# Patient Record
Sex: Male | Born: 1945 | Race: White | Hispanic: No | Marital: Married | Smoking: Former smoker
Health system: Southern US, Community
[De-identification: ages and names within clinical notes are randomized; demographics above are authoritative.]

## PROBLEM LIST (undated history)

## (undated) DIAGNOSIS — I2510 Atherosclerotic heart disease of native coronary artery without angina pectoris: Secondary | ICD-10-CM

## (undated) DIAGNOSIS — I10 Essential (primary) hypertension: Secondary | ICD-10-CM

## (undated) DIAGNOSIS — N189 Chronic kidney disease, unspecified: Secondary | ICD-10-CM

## (undated) DIAGNOSIS — I219 Acute myocardial infarction, unspecified: Secondary | ICD-10-CM

## (undated) DIAGNOSIS — E785 Hyperlipidemia, unspecified: Secondary | ICD-10-CM

## (undated) HISTORY — PX: FRACTURE SURGERY: SHX138

## (undated) HISTORY — PX: KIDNEY STONE SURGERY: SHX686

## (undated) HISTORY — PX: CARDIAC CATHETERIZATION: SHX172

## (undated) HISTORY — PX: KIDNEY SURGERY: SHX687

## (undated) HISTORY — PX: CORONARY ARTERY BYPASS GRAFT: SHX141

---

## 2004-07-19 ENCOUNTER — Ambulatory Visit: Payer: Self-pay

## 2005-06-18 ENCOUNTER — Emergency Department: Payer: Self-pay | Admitting: Emergency Medicine

## 2007-01-01 ENCOUNTER — Emergency Department: Payer: Self-pay | Admitting: Emergency Medicine

## 2007-02-22 ENCOUNTER — Inpatient Hospital Stay: Payer: Self-pay | Admitting: Urology

## 2007-03-02 ENCOUNTER — Ambulatory Visit: Payer: Self-pay | Admitting: Urology

## 2007-03-02 ENCOUNTER — Other Ambulatory Visit: Payer: Self-pay

## 2007-03-08 ENCOUNTER — Ambulatory Visit: Payer: Self-pay | Admitting: Urology

## 2007-03-22 ENCOUNTER — Ambulatory Visit: Payer: Self-pay | Admitting: Urology

## 2007-04-02 ENCOUNTER — Ambulatory Visit: Payer: Self-pay | Admitting: Urology

## 2007-04-05 ENCOUNTER — Ambulatory Visit: Payer: Self-pay | Admitting: Urology

## 2007-04-19 ENCOUNTER — Ambulatory Visit: Payer: Self-pay | Admitting: Urology

## 2007-04-23 ENCOUNTER — Ambulatory Visit: Payer: Self-pay | Admitting: Urology

## 2007-04-26 ENCOUNTER — Ambulatory Visit: Payer: Self-pay | Admitting: Urology

## 2007-05-05 ENCOUNTER — Emergency Department: Payer: Self-pay | Admitting: Emergency Medicine

## 2007-05-10 ENCOUNTER — Ambulatory Visit: Payer: Self-pay | Admitting: Urology

## 2007-05-14 ENCOUNTER — Ambulatory Visit: Payer: Self-pay | Admitting: Urology

## 2007-05-15 ENCOUNTER — Emergency Department: Payer: Self-pay | Admitting: Emergency Medicine

## 2007-05-17 ENCOUNTER — Ambulatory Visit: Payer: Self-pay | Admitting: Urology

## 2007-06-11 ENCOUNTER — Ambulatory Visit: Payer: Self-pay | Admitting: Urology

## 2009-05-30 IMAGING — CR DG ABDOMEN 1V
1 series · 2 of 2 positions shown · non-contrast
Comparison: none

REASON FOR EXAM: nephrolithiasis    please give film to patient
COMMENTS:

[Series 1: view not recorded · 0.17mm/px · 2 of 2 slices shown]
[im 1/2]
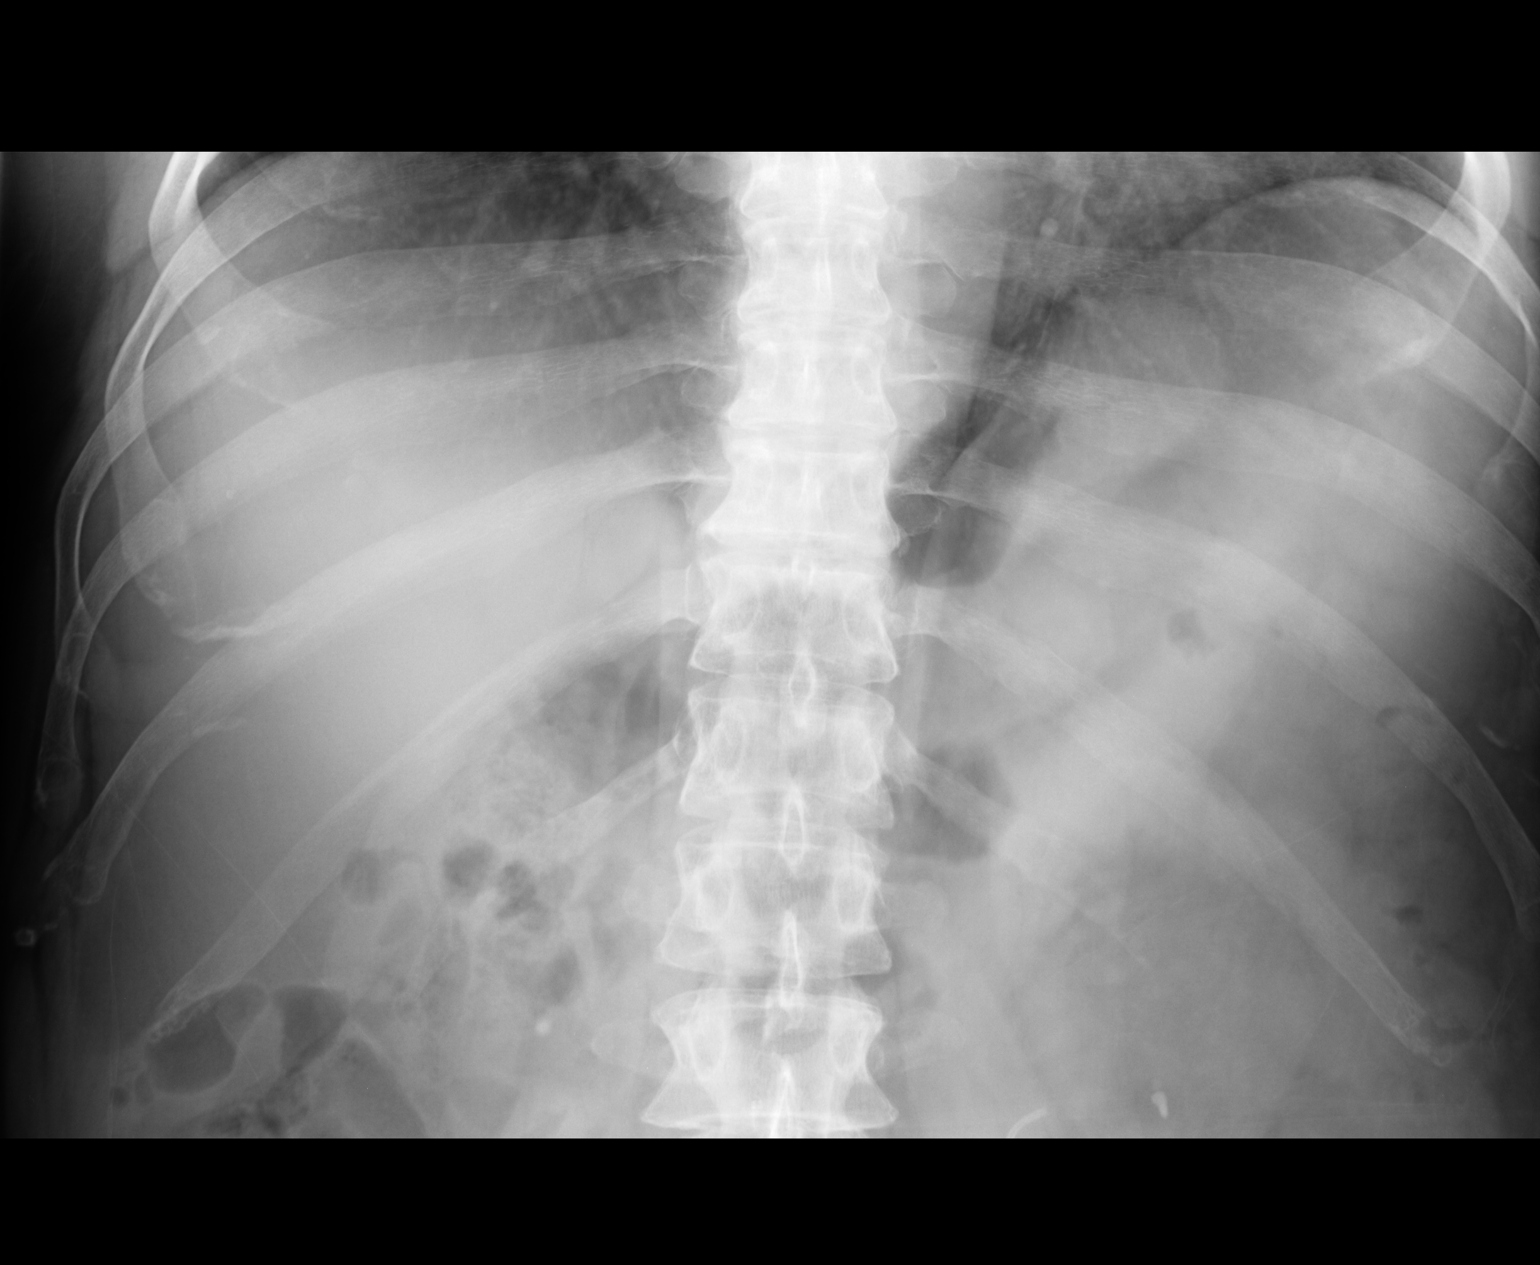
[im 2/2]
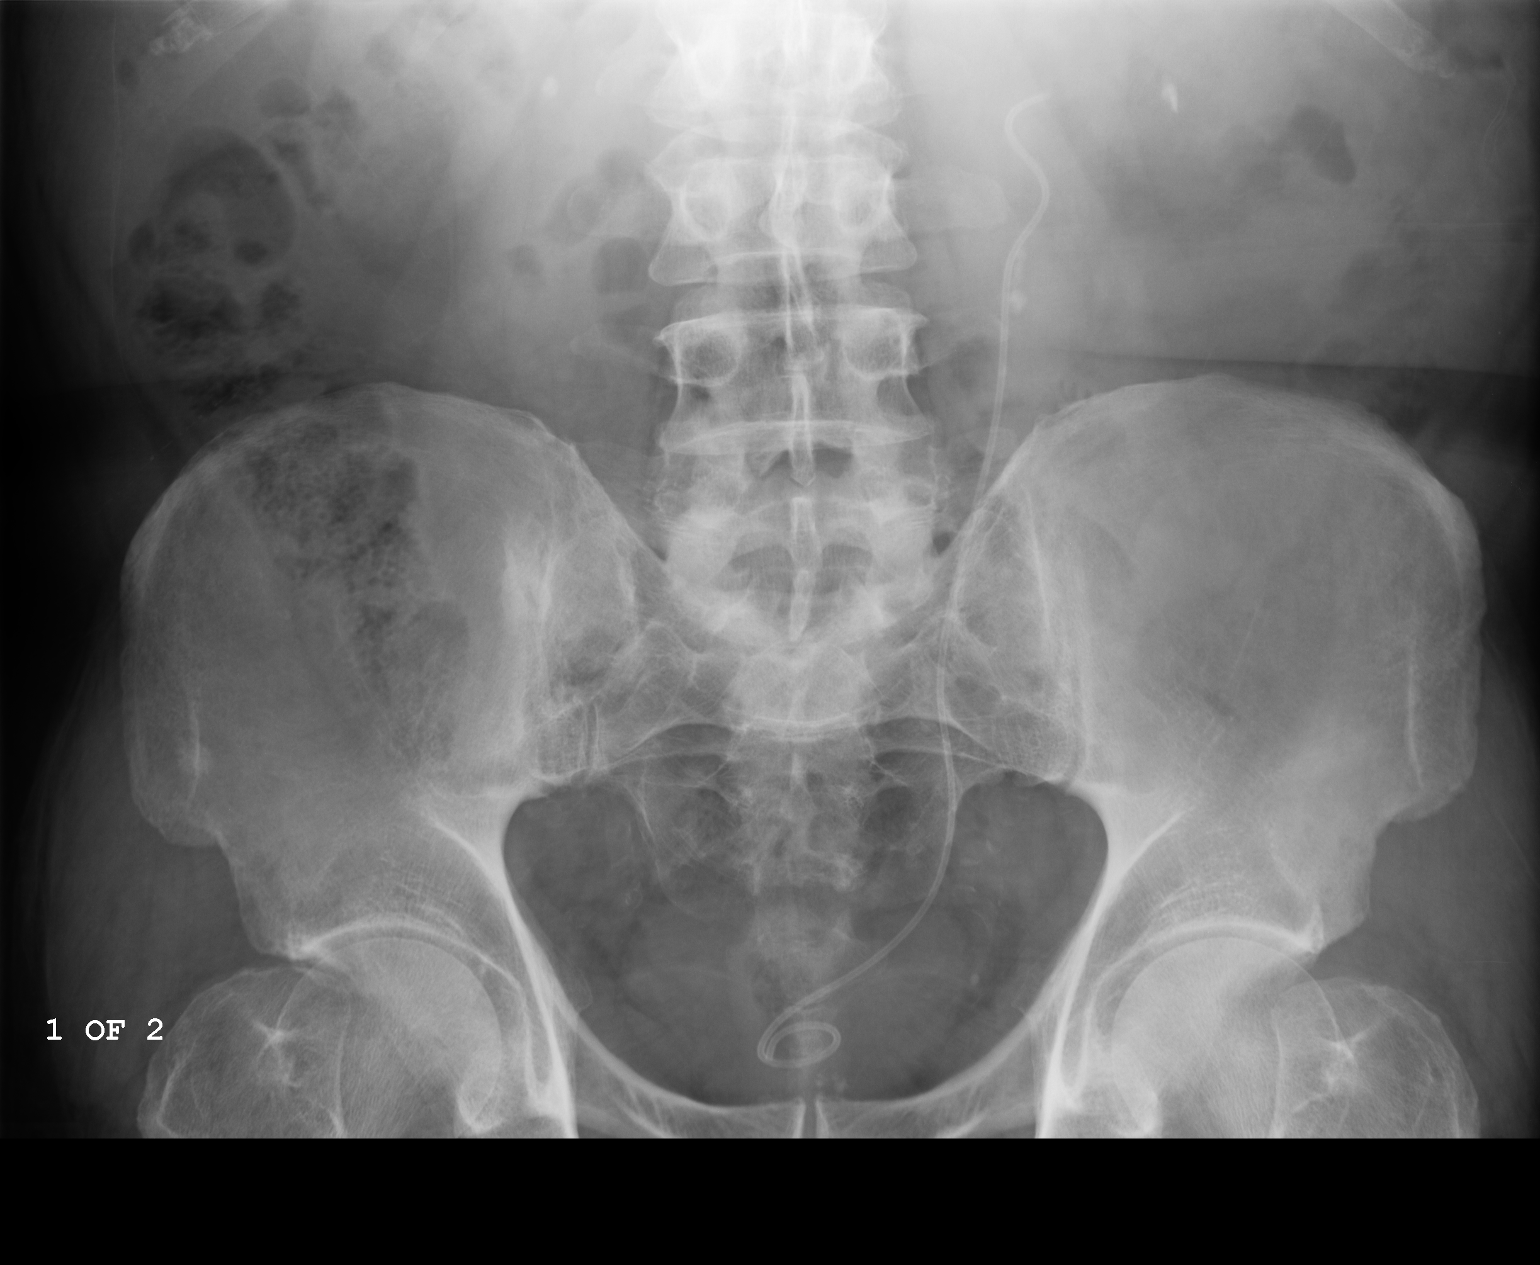

[2 of 2 positions shown; findings below may reference images not displayed]

PROCEDURE:     DXR - DXR KIDNEY URETER BLADDER  - April 19, 2007  [DATE]

RESULT:     Comparison is made to images of 04/05/2007.

The double J ureteral stent is present. The proximal end is not completely
coiled. Adjacent to the proximal portion of the stent there is some focal
density which is unchanged compared to the prior study. This may represent
continued proximal LEFT ureteral stone. Persistent density is seen in both
kidney regions consistent with bilateral renal calculi. Atherosclerotic
calcification is present. The bowel gas pattern is unremarkable.
IMPRESSION: 1. Bilateral renal calculi.
2. A LEFT-sided ureteral stent is present with what appears to be a stone
remaining in the proximal portion of the LEFT ureter.

## 2011-06-07 ENCOUNTER — Ambulatory Visit: Payer: Self-pay | Admitting: Internal Medicine

## 2011-06-29 ENCOUNTER — Ambulatory Visit: Payer: Self-pay | Admitting: Internal Medicine

## 2011-06-30 LAB — URINALYSIS, COMPLETE
Bacteria: NEGATIVE
Bilirubin,UR: NEGATIVE
Glucose,UR: NEGATIVE mg/dL (ref 0–75)
Ketone: NEGATIVE
Leukocyte Esterase: NEGATIVE
Nitrite: NEGATIVE
Ph: 6.5 (ref 4.5–8.0)
Protein: NEGATIVE
Squamous Epithelial: NONE SEEN

## 2011-06-30 LAB — CBC CANCER CENTER
Basophil %: 0.8 %
Eosinophil #: 0.2 x10 3/mm (ref 0.0–0.7)
Eosinophil %: 2.3 %
Lymphocyte %: 25.4 %
MCHC: 33.3 g/dL (ref 32.0–36.0)
Neutrophil %: 61 %
Platelet: 292 x10 3/mm (ref 150–440)
RBC: 4.47 10*6/uL (ref 4.40–5.90)

## 2011-06-30 LAB — COMPREHENSIVE METABOLIC PANEL
Albumin: 3.7 g/dL (ref 3.4–5.0)
Anion Gap: 10 (ref 7–16)
BUN: 22 mg/dL — ABNORMAL HIGH (ref 7–18)
Bilirubin,Total: 0.3 mg/dL (ref 0.2–1.0)
Chloride: 107 mmol/L (ref 98–107)
Co2: 25 mmol/L (ref 21–32)
Creatinine: 1.58 mg/dL — ABNORMAL HIGH (ref 0.60–1.30)
EGFR (African American): 56 — ABNORMAL LOW
EGFR (Non-African Amer.): 47 — ABNORMAL LOW
Glucose: 90 mg/dL (ref 65–99)
Osmolality: 286 (ref 275–301)
Potassium: 4.5 mmol/L (ref 3.5–5.1)
SGPT (ALT): 33 U/L
Sodium: 142 mmol/L (ref 136–145)
Total Protein: 7 g/dL (ref 6.4–8.2)

## 2011-06-30 LAB — PROTIME-INR: INR: 0.9

## 2011-07-04 LAB — CREATININE, SERUM
Creatinine: 1.54 mg/dL — ABNORMAL HIGH (ref 0.60–1.30)
EGFR (African American): 58 — ABNORMAL LOW

## 2011-07-08 ENCOUNTER — Ambulatory Visit: Payer: Self-pay | Admitting: Internal Medicine

## 2011-07-26 ENCOUNTER — Ambulatory Visit: Payer: Self-pay | Admitting: Urology

## 2011-08-05 ENCOUNTER — Ambulatory Visit: Payer: Self-pay | Admitting: Internal Medicine

## 2011-09-01 LAB — PATHOLOGY REPORT

## 2013-08-14 IMAGING — CT CT NECK-CHEST-ABD-PELV W/O CM
1 series · 1 of 2 positions shown · non-contrast
Comparison: none

REASON FOR EXAM: clear cell cancer metastatic from right side of nose
eval for lymphadenopath...
COMMENTS:

PROCEDURE:     PATRIA - PATRIA NECK CHEST ABD/PEL WO  - July 04, 2011 [DATE]
RESULT:
HISTORY: Clear cell cancer of nose.

[Series 1: topogram 0.6 t20s · coronal · 0.6mm · 2.00mm/px · 1 of 2 slices shown]
[im 2/2]
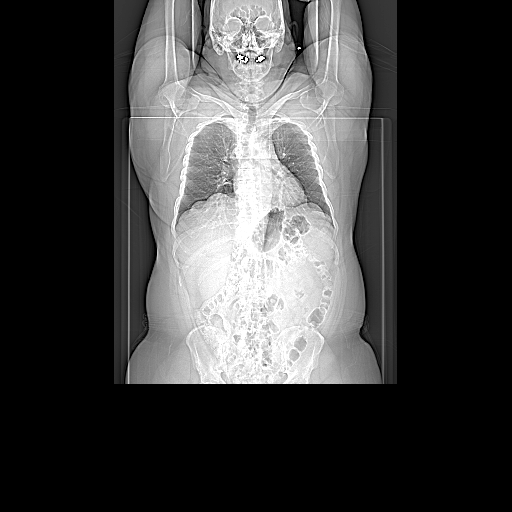

[1 of 2 positions shown; findings below may reference images not displayed]

FINDINGS: Standard nonenhanced CT was obtained due to the patient's renal
function. Skull base is normal. Metallic artifact is present about the
mandible. No cervical adenopathy noted. Larynx is normal. Thyroid is normal.
No mediastinal adenopathy noted. Coronary artery disease is present. Heart
size is normal. Large airway is patent. Tiny pulmonary nodules are present.
The largest measures approximately 8 to 9 mm and is best seen on image #49
and appears to be along the posterior aspect of the superior segment of
right lower lobe. Liver is normal. Spleen is normal. Pancreas is normal.
Adrenals are normal. Right kidney is normal. Left hydronephrosis is present.
The hydronephrosis is severe. This may be from a UPJ obstruction that is
congenital as there is severe renal atrophy present. Renal ultrasound can be
obtained for further evaluation to exclude a renal mass. A tiny stone is
noted in lower pole collecting system on the left. No stone is noted in the
ureter. Bladder is normal. Aorta nondistended. Pelvis is unremarkable.
Appendix is normal. Small axillary lymph nodes are noted.
IMPRESSION: 1. No focal abnormality is noted in the neck.
2. Coronary artery disease present.
3. Severe left hydronephrosis possibly from a left UPJ obstruction. There is
cortical atrophy. Renal ultrasound suggested to exclude a renal mass or
renal pelvocalyceal mass lesion. Tiny stone noted in the lower pole of the
left kidney. No ureteral stone noted.

## 2015-02-12 ENCOUNTER — Encounter: Payer: Self-pay | Admitting: *Deleted

## 2015-02-13 ENCOUNTER — Ambulatory Visit
Admission: RE | Admit: 2015-02-13 | Discharge: 2015-02-13 | Disposition: A | Discharge status: Home / Self Care | Payer: Commercial Managed Care - HMO | Source: Ambulatory Visit | Attending: Gastroenterology | Admitting: Gastroenterology

## 2015-02-13 ENCOUNTER — Ambulatory Visit: Payer: Commercial Managed Care - HMO | Admitting: Anesthesiology

## 2015-02-13 ENCOUNTER — Encounter
Admission: RE | Disposition: A | Discharge status: Home / Self Care | Payer: Self-pay | Source: Ambulatory Visit | Attending: Gastroenterology

## 2015-02-13 DIAGNOSIS — N189 Chronic kidney disease, unspecified: Secondary | ICD-10-CM | POA: Insufficient documentation

## 2015-02-13 DIAGNOSIS — K640 First degree hemorrhoids: Secondary | ICD-10-CM | POA: Diagnosis not present

## 2015-02-13 DIAGNOSIS — K5730 Diverticulosis of large intestine without perforation or abscess without bleeding: Secondary | ICD-10-CM | POA: Diagnosis not present

## 2015-02-13 DIAGNOSIS — Z87891 Personal history of nicotine dependence: Secondary | ICD-10-CM | POA: Diagnosis not present

## 2015-02-13 DIAGNOSIS — R7309 Other abnormal glucose: Secondary | ICD-10-CM | POA: Diagnosis not present

## 2015-02-13 DIAGNOSIS — I252 Old myocardial infarction: Secondary | ICD-10-CM | POA: Insufficient documentation

## 2015-02-13 DIAGNOSIS — Z79899 Other long term (current) drug therapy: Secondary | ICD-10-CM | POA: Insufficient documentation

## 2015-02-13 DIAGNOSIS — I2510 Atherosclerotic heart disease of native coronary artery without angina pectoris: Secondary | ICD-10-CM | POA: Diagnosis not present

## 2015-02-13 DIAGNOSIS — Z7982 Long term (current) use of aspirin: Secondary | ICD-10-CM | POA: Insufficient documentation

## 2015-02-13 DIAGNOSIS — D123 Benign neoplasm of transverse colon: Secondary | ICD-10-CM | POA: Insufficient documentation

## 2015-02-13 DIAGNOSIS — Z1211 Encounter for screening for malignant neoplasm of colon: Secondary | ICD-10-CM | POA: Insufficient documentation

## 2015-02-13 DIAGNOSIS — E785 Hyperlipidemia, unspecified: Secondary | ICD-10-CM | POA: Diagnosis not present

## 2015-02-13 DIAGNOSIS — Z955 Presence of coronary angioplasty implant and graft: Secondary | ICD-10-CM | POA: Insufficient documentation

## 2015-02-13 DIAGNOSIS — Z6835 Body mass index (BMI) 35.0-35.9, adult: Secondary | ICD-10-CM | POA: Insufficient documentation

## 2015-02-13 DIAGNOSIS — I129 Hypertensive chronic kidney disease with stage 1 through stage 4 chronic kidney disease, or unspecified chronic kidney disease: Secondary | ICD-10-CM | POA: Insufficient documentation

## 2015-02-13 HISTORY — DX: Chronic kidney disease, unspecified: N18.9

## 2015-02-13 HISTORY — DX: Acute myocardial infarction, unspecified: I21.9

## 2015-02-13 HISTORY — DX: Essential (primary) hypertension: I10

## 2015-02-13 HISTORY — DX: Atherosclerotic heart disease of native coronary artery without angina pectoris: I25.10

## 2015-02-13 HISTORY — DX: Hyperlipidemia, unspecified: E78.5

## 2015-02-13 HISTORY — PX: COLONOSCOPY: SHX5424

## 2015-02-13 SURGERY — COLONOSCOPY
Anesthesia: General

## 2015-02-13 MED ORDER — PROPOFOL 10 MG/ML IV BOLUS
INTRAVENOUS | Status: DC | PRN
  Administered 2015-02-13 (×2): 40 mg via INTRAVENOUS
  Administered 2015-02-13: 100 mg via INTRAVENOUS

## 2015-02-13 MED ORDER — PROPOFOL INFUSION 10 MG/ML OPTIME
INTRAVENOUS | Status: DC | PRN
  Administered 2015-02-13: 140 ug/kg/min via INTRAVENOUS

## 2015-02-13 MED ORDER — SODIUM CHLORIDE 0.9 % IV SOLN
INTRAVENOUS | Status: DC
  Administered 2015-02-13: 1000 mL via INTRAVENOUS

## 2015-02-13 MED ORDER — SODIUM CHLORIDE 0.9 % IV SOLN: INTRAVENOUS | Status: DC

## 2015-02-13 MED ORDER — LIDOCAINE HCL (CARDIAC) 20 MG/ML IV SOLN
INTRAVENOUS | Status: DC | PRN
  Administered 2015-02-13: 20 mg via INTRAVENOUS

## 2015-02-13 NOTE — Transfer of Care (Signed)
Immediate Anesthesia Transfer of Care Note  Patient: Connor Nelson  Procedure(s) Performed: Procedure(s): COLONOSCOPY (N/A)  Patient Location: Endoscopy Unit  Anesthesia Type:General  Level of Consciousness: sedated  Airway & Oxygen Therapy: Patient Spontanous Breathing and Patient connected to nasal cannula oxygen  Post-op Assessment: Report given to RN and Post -op Vital signs reviewed and stable  Post vital signs: Reviewed and stable  Last Vitals:  Filed Vitals:   02/13/15 0950  BP: 133/57  Pulse: 73  Temp: 36.6 C  Resp: 18    Complications: No apparent anesthesia complications

## 2015-02-13 NOTE — Op Note (Signed)
[facility] Gastroenterology Patient Name: Connor Nelson Procedure Date: 02/13/2015 9:08 AM MRN: [id number] Account #: [id number] Date of Birth: 11-13-1945 Admit Type: Outpatient Age: 69 Room: [facility] ENDO ROOM 3 Gender: Male Note Status: Finalized Procedure:         Colonoscopy Indications:       Screening for colorectal malignant neoplasm, Last                     colonoscopy 10 years ago Patient Profile:   [name]. Providers:         Gerrit Heck. Rayann Heman, MD Referring MD:      Shirline Frees (Referring MD) Medicines:         Propofol per Anesthesia Complications:     No immediate complications. Procedure:         Pre-Anesthesia Assessment:                    - Prior to the procedure, a History and Physical was                     performed, and patient medications, allergies and                     sensitivities were reviewed. The patient's tolerance of                     previous anesthesia was reviewed.                    After obtaining informed consent, the colonoscope was                     passed under direct vision. Throughout the procedure, the                     patient's blood pressure, pulse, and oxygen saturations                     were monitored continuously. The Colonoscope was                     introduced through the anus and advanced to the the cecum,                     identified by appendiceal orifice and ileocecal valve. The                     colonoscopy was performed without difficulty. The patient                     tolerated the procedure well. The quality of the bowel                     preparation was excellent. Findings:      The perianal and digital rectal examinations were normal.      A few large-mouthed diverticula were found in the sigmoid colon.      Two sessile polyps were found in the transverse colon. The polyps were 2       to 3 mm in size. These polyps were removed with a cold snare. Resection       and retrieval were complete.      Internal hemorrhoids were found during retroflexion. The hemorrhoids       were Grade  I (internal hemorrhoids that do not prolapse). Impression:        - Diverticulosis in the sigmoid colon.                    - Two 2 to 3 mm polyps in the transverse colon. Resected                     and retrieved.                    - Internal hemorrhoids. Recommendation:    - Observe patient in GI recovery unit.                    - High fiber diet.                    - Continue present medications.                    - Await pathology results.                    - Repeat colonoscopy for surveillance based on pathology                     results.                    - Obtain sleep study given extensive snoring during                     sedation.                    - Return to referring physician.                    - The findings and recommendations were discussed with the                     patient.                    - The findings and recommendations were discussed with the                     patient's family. Procedure Code(s): --- Professional ---                    [phone], Colonoscopy, flexible; with removal of tumor(s),                     polyp(s), or other lesion(s) by snare technique CPT copyright 2014 [employer]. All rights reserved. The codes documented in this report are preliminary and upon coder review may  be revised to meet current compliance requirements. Mellody Life, MD 02/13/2015 9:48:51 AM This report has been signed electronically. Number of Addenda: 0 Note Initiated On: 02/13/2015 9:08 AM Scope Withdrawal Time: 0 hours 9 minutes 30 seconds  Total Procedure Duration: 0 hours 20 minutes 10 seconds       [facility]

## 2015-02-13 NOTE — Anesthesia Preprocedure Evaluation (Signed)
Anesthesia Evaluation  Patient identified by MRN, date of birth, ID band Patient awake    Reviewed: Allergy & Precautions, H&P , NPO status , Patient's Chart, lab work & pertinent test results, reviewed documented beta blocker date and time   History of Anesthesia Complications Negative for: history of anesthetic complications  Airway Mallampati: IV  TM Distance: >3 FB Neck ROM: full  Mouth opening: Limited Mouth Opening  Dental no notable dental hx. (+) Teeth Intact   Pulmonary neg shortness of breath, sleep apnea (likely based on history) , neg COPD, neg recent URI, former smoker,    Pulmonary exam normal breath sounds clear to auscultation       Cardiovascular Exercise Tolerance: Good hypertension, On Medications (-) angina+ CAD, + Past MI and + Cardiac Stents (Last stent placed 12-13 years ago)  (-) CABG Normal cardiovascular exam(-) dysrhythmias (-) Valvular Problems/Murmurs Rhythm:regular Rate:Normal     Neuro/Psych negative neurological ROS  negative psych ROS   GI/Hepatic negative GI ROS, Neg liver ROS,   Endo/Other  diabetes (borderline)Morbid obesity  Renal/GU CRFRenal disease  negative genitourinary   Musculoskeletal   Abdominal   Peds  Hematology negative hematology ROS (+)   Anesthesia Other Findings Past Medical History:   Hypertension                                                 Myocardial infarction                                        Hyperlipidemia                                               Coronary artery disease                                      Chronic kidney disease                                       Reproductive/Obstetrics negative OB ROS                             Anesthesia Physical Anesthesia Plan  ASA: III  Anesthesia Plan: General   Post-op Pain Management:    Induction:   Airway Management Planned:   Additional Equipment:    Intra-op Plan:   Post-operative Plan:   Informed Consent: I have reviewed the patients History and Physical, chart, labs and discussed the procedure including the risks, benefits and alternatives for the proposed anesthesia with the patient or authorized representative who has indicated his/her understanding and acceptance.   Dental Advisory Given  Plan Discussed with: Anesthesiologist, CRNA and Surgeon  Anesthesia Plan Comments:         Anesthesia Quick Evaluation

## 2015-02-13 NOTE — H&P (Signed)
  Primary Care Physician:  Sherrin Daisy, MD  Pre-Procedure History & Physical: HPI:  Connor Nelson is a 69 y.o. male is here for an colonoscopy.   Past Medical History  Diagnosis Date  . Hypertension   . Myocardial infarction   . Hyperlipidemia   . Coronary artery disease   . Chronic kidney disease     Past Surgical History  Procedure Laterality Date  . Kidney surgery    . Kidney stone surgery    . Cardiac catheterization    . Coronary artery bypass graft    . Fracture surgery      Prior to Admission medications   Medication Sig Start Date End Date Taking? Authorizing Provider  amLODipine (NORVASC) 10 MG tablet Take 10 mg by mouth daily.   Yes Historical Provider, MD  aspirin EC 81 MG tablet Take 81 mg by mouth daily.   Yes Historical Provider, MD  lisinopril (PRINIVIL,ZESTRIL) 20 MG tablet Take 20 mg by mouth daily.   Yes Historical Provider, MD  lovastatin (MEVACOR) 40 MG tablet Take 40 mg by mouth at bedtime.   Yes Historical Provider, MD    Allergies as of 01/22/2015  . (Not on File)    History reviewed. No pertinent family history.  Social History   Social History  . Marital Status: Married    Spouse Name: N/A  . Number of Children: N/A  . Years of Education: N/A   Occupational History  . Not on file.   Social History Main Topics  . Smoking status: Former Research scientist (life sciences)  . Smokeless tobacco: Never Used  . Alcohol Use: No  . Drug Use: No  . Sexual Activity: Not on file   Other Topics Concern  . Not on file   Social History Narrative     Physical Exam: BP 152/68 mmHg  Pulse 77  Temp(Src) 98 F (36.7 C) (Tympanic)  Resp 18  Ht 5\' 10"  (1.778 m)  Wt 113.399 kg (250 lb)  BMI 35.87 kg/m2  SpO2 98% General:   Alert,  pleasant and cooperative in NAD Head:  Normocephalic and atraumatic. Neck:  Supple; no masses or thyromegaly. Lungs:  Clear throughout to auscultation.    Heart:  Regular rate and rhythm. Abdomen:  Soft, nontender and  nondistended. Normal bowel sounds, without guarding, and without rebound.   Neurologic:  Alert and  oriented x4;  grossly normal neurologically.  Impression/Plan: [name] is here for an colonoscopy to be performed for screening  Risks, benefits, limitations, and alternatives regarding  colonoscopy have been reviewed with the patient.  Questions have been answered.  All parties agreeable.   Josefine Class, MD  02/13/2015, 9:08 AM

## 2015-02-13 NOTE — Anesthesia Postprocedure Evaluation (Signed)
  Anesthesia Post-op Note  Patient: Connor Nelson  Procedure(s) Performed: Procedure(s): COLONOSCOPY (N/A)  Anesthesia type:General  Patient location: PACU  Post pain: Pain level controlled  Post assessment: Post-op Vital signs reviewed, Patient's Cardiovascular Status Stable, Respiratory Function Stable, Patent Airway and No signs of Nausea or vomiting  Post vital signs: Reviewed and stable  Last Vitals:  Filed Vitals:   02/13/15 1020  BP: 134/74  Pulse: 73  Temp:   Resp: 20    Level of consciousness: awake, alert  and patient cooperative  Complications: No apparent anesthesia complications

## 2015-02-13 NOTE — Anesthesia Procedure Notes (Signed)
Date/Time: 02/13/2015 9:15 AM Performed by: Martha Clan Pre-anesthesia Checklist: Patient identified, Emergency Drugs available, Suction available, Patient being monitored and Timeout performed Patient Re-evaluated:Patient Re-evaluated prior to inductionOxygen Delivery Method: Nasal cannula Preoxygenation: Pre-oxygenation with 100% oxygen Intubation Type: IV induction

## 2015-02-13 NOTE — Discharge Instructions (Signed)

## 2015-02-16 LAB — SURGICAL PATHOLOGY

## 2015-06-18 DIAGNOSIS — H353131 Nonexudative age-related macular degeneration, bilateral, early dry stage: Secondary | ICD-10-CM | POA: Diagnosis not present

## 2015-07-09 DIAGNOSIS — R7303 Prediabetes: Secondary | ICD-10-CM | POA: Diagnosis not present

## 2015-07-09 DIAGNOSIS — I10 Essential (primary) hypertension: Secondary | ICD-10-CM | POA: Diagnosis not present

## 2015-07-09 DIAGNOSIS — E7800 Pure hypercholesterolemia, unspecified: Secondary | ICD-10-CM | POA: Diagnosis not present

## 2015-11-30 DIAGNOSIS — M722 Plantar fascial fibromatosis: Secondary | ICD-10-CM | POA: Diagnosis not present

## 2015-11-30 DIAGNOSIS — M7672 Peroneal tendinitis, left leg: Secondary | ICD-10-CM | POA: Diagnosis not present

## 2015-12-24 DIAGNOSIS — M722 Plantar fascial fibromatosis: Secondary | ICD-10-CM | POA: Diagnosis not present

## 2016-01-07 DIAGNOSIS — I10 Essential (primary) hypertension: Secondary | ICD-10-CM | POA: Diagnosis not present

## 2016-01-07 DIAGNOSIS — R7303 Prediabetes: Secondary | ICD-10-CM | POA: Diagnosis not present

## 2016-01-07 DIAGNOSIS — E7800 Pure hypercholesterolemia, unspecified: Secondary | ICD-10-CM | POA: Diagnosis not present

## 2016-01-12 DIAGNOSIS — F419 Anxiety disorder, unspecified: Secondary | ICD-10-CM | POA: Diagnosis not present

## 2016-01-12 DIAGNOSIS — R079 Chest pain, unspecified: Secondary | ICD-10-CM | POA: Diagnosis not present
